# Patient Record
Sex: Female | Born: 1970 | Race: Black or African American | Hispanic: No | Marital: Single | Smoking: Never smoker
Health system: Southern US, Community
[De-identification: ages and names within clinical notes are randomized; demographics above are authoritative.]

## PROBLEM LIST (undated history)

## (undated) DIAGNOSIS — N941 Unspecified dyspareunia: Secondary | ICD-10-CM

## (undated) DIAGNOSIS — Z87440 Personal history of urinary (tract) infections: Secondary | ICD-10-CM

## (undated) DIAGNOSIS — N760 Acute vaginitis: Secondary | ICD-10-CM

## (undated) DIAGNOSIS — B009 Herpesviral infection, unspecified: Secondary | ICD-10-CM

## (undated) DIAGNOSIS — R5383 Other fatigue: Secondary | ICD-10-CM

## (undated) DIAGNOSIS — Z8742 Personal history of other diseases of the female genital tract: Secondary | ICD-10-CM

## (undated) DIAGNOSIS — R87619 Unspecified abnormal cytological findings in specimens from cervix uteri: Secondary | ICD-10-CM

## (undated) DIAGNOSIS — R3129 Other microscopic hematuria: Secondary | ICD-10-CM

## (undated) DIAGNOSIS — N979 Female infertility, unspecified: Secondary | ICD-10-CM

## (undated) DIAGNOSIS — B373 Candidiasis of vulva and vagina: Secondary | ICD-10-CM

## (undated) DIAGNOSIS — IMO0002 Reserved for concepts with insufficient information to code with codable children: Secondary | ICD-10-CM

## (undated) DIAGNOSIS — D219 Benign neoplasm of connective and other soft tissue, unspecified: Secondary | ICD-10-CM

## (undated) DIAGNOSIS — N840 Polyp of corpus uteri: Secondary | ICD-10-CM

## (undated) DIAGNOSIS — Z87448 Personal history of other diseases of urinary system: Secondary | ICD-10-CM

## (undated) DIAGNOSIS — N926 Irregular menstruation, unspecified: Secondary | ICD-10-CM

## (undated) DIAGNOSIS — B9689 Other specified bacterial agents as the cause of diseases classified elsewhere: Secondary | ICD-10-CM

## (undated) DIAGNOSIS — R102 Pelvic and perineal pain: Secondary | ICD-10-CM

## (undated) HISTORY — PX: WISDOM TOOTH EXTRACTION: SHX21

## (undated) HISTORY — DX: Other microscopic hematuria: R31.29

## (undated) HISTORY — DX: Other fatigue: R53.83

## (undated) HISTORY — DX: Other specified bacterial agents as the cause of diseases classified elsewhere: B96.89

## (undated) HISTORY — DX: Irregular menstruation, unspecified: N92.6

## (undated) HISTORY — DX: Candidiasis of vulva and vagina: B37.3

## (undated) HISTORY — DX: Female infertility, unspecified: N97.9

## (undated) HISTORY — DX: Reserved for concepts with insufficient information to code with codable children: IMO0002

## (undated) HISTORY — DX: Unspecified dyspareunia: N94.10

## (undated) HISTORY — DX: Personal history of urinary (tract) infections: Z87.440

## (undated) HISTORY — DX: Polyp of corpus uteri: N84.0

## (undated) HISTORY — DX: Herpesviral infection, unspecified: B00.9

## (undated) HISTORY — DX: Pelvic and perineal pain: R10.2

## (undated) HISTORY — DX: Acute vaginitis: N76.0

## (undated) HISTORY — DX: Benign neoplasm of connective and other soft tissue, unspecified: D21.9

## (undated) HISTORY — DX: Unspecified abnormal cytological findings in specimens from cervix uteri: R87.619

## (undated) HISTORY — DX: Personal history of other diseases of urinary system: Z87.448

## (undated) HISTORY — DX: Personal history of other diseases of the female genital tract: Z87.42

---

## 1997-10-21 ENCOUNTER — Emergency Department (HOSPITAL_COMMUNITY): Admission: EM | Admit: 1997-10-21 | Discharge: 1997-10-21 | Payer: Self-pay | Admitting: Emergency Medicine

## 1997-10-22 ENCOUNTER — Encounter: Payer: Self-pay | Admitting: Emergency Medicine

## 1997-12-29 ENCOUNTER — Other Ambulatory Visit: Admission: RE | Admit: 1997-12-29 | Discharge: 1997-12-29 | Payer: Self-pay | Admitting: Obstetrics & Gynecology

## 1998-01-28 ENCOUNTER — Emergency Department (HOSPITAL_COMMUNITY): Admission: EM | Admit: 1998-01-28 | Discharge: 1998-01-28 | Payer: Self-pay | Admitting: Emergency Medicine

## 1998-02-14 ENCOUNTER — Encounter: Admission: RE | Admit: 1998-02-14 | Discharge: 1998-04-03 | Payer: Self-pay | Admitting: Family Medicine

## 1998-02-16 ENCOUNTER — Ambulatory Visit (HOSPITAL_COMMUNITY): Admission: RE | Admit: 1998-02-16 | Discharge: 1998-02-16 | Payer: Self-pay | Admitting: Family Medicine

## 1998-02-16 ENCOUNTER — Encounter: Payer: Self-pay | Admitting: Family Medicine

## 1998-07-03 ENCOUNTER — Encounter: Admission: RE | Admit: 1998-07-03 | Discharge: 1998-09-01 | Payer: Self-pay | Admitting: Family Medicine

## 1998-09-06 ENCOUNTER — Ambulatory Visit (HOSPITAL_BASED_OUTPATIENT_CLINIC_OR_DEPARTMENT_OTHER): Admission: RE | Admit: 1998-09-06 | Discharge: 1998-09-06 | Payer: Self-pay | Admitting: Orthopedic Surgery

## 1998-12-01 ENCOUNTER — Encounter: Admission: RE | Admit: 1998-12-01 | Discharge: 1999-01-30 | Payer: Self-pay | Admitting: Family Medicine

## 1999-03-30 ENCOUNTER — Other Ambulatory Visit: Admission: RE | Admit: 1999-03-30 | Discharge: 1999-03-30 | Payer: Self-pay | Admitting: Obstetrics and Gynecology

## 2000-08-18 ENCOUNTER — Other Ambulatory Visit: Admission: RE | Admit: 2000-08-18 | Discharge: 2000-08-18 | Payer: Self-pay | Admitting: Obstetrics and Gynecology

## 2001-02-18 DIAGNOSIS — B9689 Other specified bacterial agents as the cause of diseases classified elsewhere: Secondary | ICD-10-CM

## 2001-02-18 HISTORY — DX: Other specified bacterial agents as the cause of diseases classified elsewhere: B96.89

## 2001-07-08 DIAGNOSIS — Z8742 Personal history of other diseases of the female genital tract: Secondary | ICD-10-CM

## 2001-07-08 HISTORY — DX: Personal history of other diseases of the female genital tract: Z87.42

## 2001-09-29 ENCOUNTER — Other Ambulatory Visit: Admission: RE | Admit: 2001-09-29 | Discharge: 2001-09-29 | Payer: Self-pay | Admitting: Obstetrics and Gynecology

## 2002-10-29 ENCOUNTER — Other Ambulatory Visit: Admission: RE | Admit: 2002-10-29 | Discharge: 2002-10-29 | Payer: Self-pay | Admitting: Obstetrics and Gynecology

## 2003-02-19 DIAGNOSIS — R5383 Other fatigue: Secondary | ICD-10-CM

## 2003-02-19 HISTORY — DX: Other fatigue: R53.83

## 2003-10-31 ENCOUNTER — Other Ambulatory Visit: Admission: RE | Admit: 2003-10-31 | Discharge: 2003-10-31 | Payer: Self-pay | Admitting: Obstetrics and Gynecology

## 2004-11-12 ENCOUNTER — Other Ambulatory Visit: Admission: RE | Admit: 2004-11-12 | Discharge: 2004-11-12 | Payer: Self-pay | Admitting: Obstetrics and Gynecology

## 2005-02-18 DIAGNOSIS — N926 Irregular menstruation, unspecified: Secondary | ICD-10-CM

## 2005-02-18 DIAGNOSIS — Z87440 Personal history of urinary (tract) infections: Secondary | ICD-10-CM

## 2005-02-18 HISTORY — DX: Personal history of urinary (tract) infections: Z87.440

## 2005-02-18 HISTORY — DX: Irregular menstruation, unspecified: N92.6

## 2006-02-18 DIAGNOSIS — B3731 Acute candidiasis of vulva and vagina: Secondary | ICD-10-CM

## 2006-02-18 DIAGNOSIS — B373 Candidiasis of vulva and vagina: Secondary | ICD-10-CM

## 2006-02-18 HISTORY — DX: Candidiasis of vulva and vagina: B37.3

## 2006-02-18 HISTORY — DX: Acute candidiasis of vulva and vagina: B37.31

## 2008-02-19 DIAGNOSIS — B373 Candidiasis of vulva and vagina: Secondary | ICD-10-CM

## 2008-02-19 DIAGNOSIS — Z87448 Personal history of other diseases of urinary system: Secondary | ICD-10-CM

## 2008-02-19 DIAGNOSIS — R102 Pelvic and perineal pain: Secondary | ICD-10-CM

## 2008-02-19 DIAGNOSIS — D219 Benign neoplasm of connective and other soft tissue, unspecified: Secondary | ICD-10-CM

## 2008-02-19 DIAGNOSIS — B3731 Acute candidiasis of vulva and vagina: Secondary | ICD-10-CM

## 2008-02-19 HISTORY — DX: Acute candidiasis of vulva and vagina: B37.31

## 2008-02-19 HISTORY — DX: Candidiasis of vulva and vagina: B37.3

## 2008-02-19 HISTORY — DX: Pelvic and perineal pain: R10.2

## 2008-02-19 HISTORY — DX: Personal history of other diseases of urinary system: Z87.448

## 2008-02-19 HISTORY — DX: Benign neoplasm of connective and other soft tissue, unspecified: D21.9

## 2008-10-21 DIAGNOSIS — R3129 Other microscopic hematuria: Secondary | ICD-10-CM

## 2008-10-21 HISTORY — DX: Other microscopic hematuria: R31.29

## 2009-02-18 DIAGNOSIS — N941 Unspecified dyspareunia: Secondary | ICD-10-CM

## 2009-02-18 HISTORY — DX: Unspecified dyspareunia: N94.10

## 2009-06-10 ENCOUNTER — Ambulatory Visit (HOSPITAL_COMMUNITY): Admission: RE | Admit: 2009-06-10 | Discharge: 2009-06-10 | Payer: Self-pay | Admitting: Obstetrics and Gynecology

## 2009-06-10 DIAGNOSIS — N979 Female infertility, unspecified: Secondary | ICD-10-CM

## 2009-06-10 DIAGNOSIS — Z8742 Personal history of other diseases of the female genital tract: Secondary | ICD-10-CM

## 2009-06-10 DIAGNOSIS — N840 Polyp of corpus uteri: Secondary | ICD-10-CM

## 2009-06-10 HISTORY — DX: Polyp of corpus uteri: N84.0

## 2009-06-10 HISTORY — DX: Personal history of other diseases of the female genital tract: Z87.42

## 2009-06-10 HISTORY — DX: Female infertility, unspecified: N97.9

## 2010-05-08 LAB — URINALYSIS, ROUTINE W REFLEX MICROSCOPIC
Bilirubin Urine: NEGATIVE
Glucose, UA: NEGATIVE mg/dL
Hgb urine dipstick: NEGATIVE
Ketones, ur: NEGATIVE mg/dL
Nitrite: NEGATIVE
Protein, ur: NEGATIVE mg/dL
Specific Gravity, Urine: >1.03 — ABNORMAL HIGH (ref 1.005–1.030)
Urobilinogen, UA: 0.2 mg/dL (ref 0.0–1.0)
pH: 5.5 (ref 5.0–8.0)

## 2010-05-08 LAB — CBC
HCT: 32.3 % — ABNORMAL LOW (ref 36.0–46.0)
Hemoglobin: 10.9 g/dL — ABNORMAL LOW (ref 12.0–15.0)
MCHC: 33.6 g/dL (ref 30.0–36.0)
MCV: 91 fL (ref 78.0–100.0)
Platelets: 270 10*3/uL (ref 150–400)
RBC: 3.55 MIL/uL — ABNORMAL LOW (ref 3.87–5.11)
RDW: 13 % (ref 11.5–15.5)
WBC: 5.6 10*3/uL (ref 4.0–10.5)

## 2010-05-08 LAB — PREGNANCY, URINE: Preg Test, Ur: NEGATIVE

## 2010-05-28 ENCOUNTER — Other Ambulatory Visit (HOSPITAL_COMMUNITY): Payer: Self-pay | Admitting: Obstetrics and Gynecology

## 2010-05-28 DIAGNOSIS — N979 Female infertility, unspecified: Secondary | ICD-10-CM

## 2010-06-04 ENCOUNTER — Ambulatory Visit (HOSPITAL_COMMUNITY)
Admission: RE | Admit: 2010-06-04 | Discharge: 2010-06-04 | Disposition: A | Discharge status: Home / Self Care | Payer: BC Managed Care – PPO | Source: Ambulatory Visit | Attending: Obstetrics and Gynecology | Admitting: Obstetrics and Gynecology

## 2010-06-04 DIAGNOSIS — N979 Female infertility, unspecified: Secondary | ICD-10-CM | POA: Insufficient documentation

## 2011-02-14 ENCOUNTER — Encounter (HOSPITAL_COMMUNITY): Payer: Self-pay | Admitting: *Deleted

## 2011-02-14 ENCOUNTER — Inpatient Hospital Stay (HOSPITAL_COMMUNITY)
Admission: AD | Admit: 2011-02-14 | Discharge: 2011-02-15 | Disposition: A | Discharge status: Home / Self Care | Payer: BC Managed Care – PPO | Source: Ambulatory Visit | Attending: Obstetrics and Gynecology | Admitting: Obstetrics and Gynecology

## 2011-02-14 DIAGNOSIS — O039 Complete or unspecified spontaneous abortion without complication: Secondary | ICD-10-CM | POA: Insufficient documentation

## 2011-02-14 LAB — CBC
MCV: 90.3 fL (ref 78.0–100.0)
Platelets: 274 10*3/uL (ref 150–400)
RBC: 3.62 MIL/uL — ABNORMAL LOW (ref 3.87–5.11)
RDW: 12.8 % (ref 11.5–15.5)
WBC: 7.8 10*3/uL (ref 4.0–10.5)

## 2011-02-14 MED ORDER — OXYCODONE-ACETAMINOPHEN 5-325 MG PO TABS
2.0000 | ORAL_TABLET | Freq: Once | ORAL | Status: AC
  Administered 2011-02-14: 2 via ORAL
  Filled 2011-02-14: qty 2

## 2011-02-14 MED ORDER — KETOROLAC TROMETHAMINE 30 MG/ML IJ SOLN
60.0000 mg | Freq: Once | INTRAMUSCULAR | Status: AC
  Administered 2011-02-14: 60 mg via INTRAMUSCULAR
  Filled 2011-02-14: qty 2

## 2011-02-14 NOTE — Progress Notes (Signed)
Pt G1 at 11.3wks, told today in the office- no fetal heartbeat.  Pt having cramping and vaginal bleeding started today.  Fetal size 8wk per U/S today.

## 2011-02-14 NOTE — ED Provider Notes (Signed)
History     Chief Complaint  Patient presents with  . Abdominal Cramping  . Vaginal Bleeding   HPI Comments: Pt is a 40yo G1P0 at [redacted]w[redacted]d by LMP of 05/26/10. She was seen today at the office and found to have a missed AB with fetus measuring about 8wks. In the last 2 hours she has had an increase in bleeding like a menstrual period and passed a clot. She also has increased pain/cramping. She denies any significant med hx. NKDA, unsure of blood type.   Abdominal Cramping  Vaginal Bleeding Associated symptoms include abdominal pain.      Past Medical History  Diagnosis Date  . No pertinent past medical history     No past surgical history on file.  No family history on file.  History  Substance Use Topics  . Smoking status: Not on file  . Smokeless tobacco: Not on file  . Alcohol Use: Not on file    Allergies: No Known Allergies  Prescriptions prior to admission  Medication Sig Dispense Refill  . OVER THE COUNTER MEDICATION Take 1 tablet by mouth daily. "Hairfinity" supplement       . Prenatal Vit-Fe Fumarate-FA (PRENATAL MULTIVITAMIN) TABS Take 1 tablet by mouth daily.          Review of Systems  Gastrointestinal: Positive for abdominal pain.       Cramping  Genitourinary: Positive for vaginal bleeding.  All other systems reviewed and are negative.   Physical Exam   Blood pressure 130/98, pulse 86, temperature 98.5 F (36.9 C), temperature source Oral, resp. rate 18, height 5\' 2"  (1.575 m), weight 60.873 kg (134 lb 3.2 oz), last menstrual period 05/26/2010.  Physical Exam  Nursing note and vitals reviewed. Constitutional: She is oriented to person, place, and time. She appears well-developed and well-nourished. She appears distressed.       Writhing in bed, grimacing  Cardiovascular: Normal rate.   Respiratory: Effort normal.  GI: Soft. She exhibits no distension and no mass. There is tenderness. There is no rebound and no guarding.  Genitourinary: Vaginal  discharge found.       Deferred pelvic until patient has had pain medication  Musculoskeletal: Normal range of motion.  Neurological: She is alert and oriented to person, place, and time.  Skin: Skin is warm and dry.  Psychiatric: She has a normal mood and affect. Her behavior is normal. Judgment and thought content normal.    MAU Course  Procedures    Assessment and Plan  AB in progress  Cbc Quant T&S toradol and percocet Will then do pelvic exam  Dyland Panuco M 02/14/2011, 11:24 PM

## 2011-02-14 NOTE — Progress Notes (Signed)
Pt presents to MAU with chief complaint of vaginal bleeding. Pt was at the office today and she was told baby did not have a heart beat. Pt was unable to make a decision about next steps. Presents to MAU with abdominal pain and bleeding that has worsened.

## 2011-02-15 LAB — TYPE AND SCREEN
ABO/RH(D): A POS
Antibody Screen: NEGATIVE

## 2011-02-15 MED ORDER — PROMETHAZINE HCL 25 MG/ML IJ SOLN
25.0000 mg | Freq: Once | INTRAMUSCULAR | Status: AC
  Administered 2011-02-15: 25 mg via INTRAMUSCULAR
  Filled 2011-02-15: qty 1

## 2011-02-15 MED ORDER — IBUPROFEN 800 MG PO TABS: 800.0000 mg | ORAL_TABLET | Freq: Three times a day (TID) | ORAL | Status: AC | PRN

## 2011-02-15 MED ORDER — MORPHINE SULFATE 4 MG/ML IJ SOLN
8.0000 mg | Freq: Once | INTRAMUSCULAR | Status: DC
  Filled 2011-02-15: qty 2

## 2011-02-15 MED ORDER — OXYCODONE-ACETAMINOPHEN 5-325 MG PO TABS: 1.0000 | ORAL_TABLET | ORAL | Status: AC | PRN

## 2011-02-15 MED ORDER — MORPHINE SULFATE 4 MG/ML IJ SOLN: 8.0000 mg | Freq: Once | INTRAMUSCULAR | Status: DC

## 2011-02-15 NOTE — Progress Notes (Signed)
Gevena Barre, CNM at bedside.  poc discussed with pt.

## 2011-02-15 NOTE — Progress Notes (Signed)
Pt states pain is not any better.  CNM notified.

## 2011-02-15 NOTE — Progress Notes (Signed)
Entered room to administer morphine and phenergan. Pt states she felt a large gush.  States she felt relief after.  Asked pt if she thought she needed the morphine.  Pt states she would like to wait and not get medicine.  Pt up to bathroom to pee.  When urinating called into bathroom to view clot in toilet.  Appears to be miscarriage.  Pt states her pain is gone.  CNM notified.  Viewed clot in toiled.  Ok to discard specimen.

## 2011-02-15 NOTE — Progress Notes (Addendum)
Pt has no pain now, up to void and passed what appears to be POC No active bleeding now Blood type is A pos Quant is 1408  D/C home  F/u office 1 week Will have weekly quants drawn Bleeding precautions and miscarriage instructions given

## 2011-02-15 NOTE — Progress Notes (Signed)
S: pt feels no relief from pain meds, continues to writhe and moan in pain O: CBC is nl Pad on scant blood from the last 2 hours A: ab in progress P: morphine/phenergan IM for pain Discussed options with pt and she elects for [facility], last ate at about 10pm Will D/W Dr Stefano Gaul

## 2011-02-15 NOTE — Progress Notes (Signed)
Gevena Barre, CNM at bedside.  Discussed D&C versus cytotec.  Pt wishes to have D&C.  Pt states she ate 2 and a half hours ago.

## 2011-04-17 ENCOUNTER — Encounter (INDEPENDENT_AMBULATORY_CARE_PROVIDER_SITE_OTHER): Payer: BC Managed Care – PPO | Admitting: Obstetrics and Gynecology

## 2011-04-17 DIAGNOSIS — N970 Female infertility associated with anovulation: Secondary | ICD-10-CM

## 2011-07-22 ENCOUNTER — Other Ambulatory Visit: Payer: Self-pay | Admitting: Obstetrics and Gynecology

## 2011-07-24 ENCOUNTER — Telehealth: Payer: Self-pay

## 2011-07-24 NOTE — Telephone Encounter (Signed)
Rx refill requested for Clomid 50mg  awaiting AVS approval.

## 2011-07-29 ENCOUNTER — Telehealth: Payer: Self-pay

## 2011-07-29 NOTE — Telephone Encounter (Signed)
LVMOM for pt to call back. PEr AVS Rx request Clomid was denied b/c pt will need to come in for appt. Will await pt call back. LC/CMA

## 2011-08-01 ENCOUNTER — Telehealth: Payer: Self-pay | Admitting: Obstetrics and Gynecology

## 2011-08-01 NOTE — Telephone Encounter (Signed)
Tc from pt per telephone call. Told pt avs recs of needed an appt for start of Clomid. Appt sched 08/15/11 with avs for eval. Pt voices understanding.

## 2011-08-01 NOTE — Telephone Encounter (Signed)
Lm on vm to cb per telephone call.  

## 2011-08-01 NOTE — Telephone Encounter (Signed)
Triage/contact notes state Colleen Luna has cld several times

## 2011-08-02 ENCOUNTER — Telehealth: Payer: Self-pay

## 2011-08-02 NOTE — Telephone Encounter (Signed)
Pt was called again to be made aware that she needs an appt. before she can receive a Rx for clomid.pt was last called on 07-29-11. I will await pt call back.

## 2011-08-15 ENCOUNTER — Telehealth: Payer: Self-pay | Admitting: Obstetrics and Gynecology

## 2011-08-15 ENCOUNTER — Ambulatory Visit (INDEPENDENT_AMBULATORY_CARE_PROVIDER_SITE_OTHER): Payer: BC Managed Care – PPO | Admitting: Obstetrics and Gynecology

## 2011-08-15 ENCOUNTER — Encounter: Payer: Self-pay | Admitting: Obstetrics and Gynecology

## 2011-08-15 VITALS — BP 98/68 | Temp 98.3°F | Resp 16 | Ht 62.5 in | Wt 131.0 lb

## 2011-08-15 DIAGNOSIS — O021 Missed abortion: Secondary | ICD-10-CM

## 2011-08-15 DIAGNOSIS — N970 Female infertility associated with anovulation: Secondary | ICD-10-CM

## 2011-08-15 DIAGNOSIS — B009 Herpesviral infection, unspecified: Secondary | ICD-10-CM

## 2011-08-15 DIAGNOSIS — D869 Sarcoidosis, unspecified: Secondary | ICD-10-CM

## 2011-08-15 DIAGNOSIS — Z8619 Personal history of other infectious and parasitic diseases: Secondary | ICD-10-CM

## 2011-08-15 DIAGNOSIS — Z3201 Encounter for pregnancy test, result positive: Secondary | ICD-10-CM

## 2011-08-15 MED ORDER — CLOMIPHENE CITRATE 50 MG PO TABS: 50.0000 mg | ORAL_TABLET | Freq: Every day | ORAL | Status: DC

## 2011-08-15 NOTE — Telephone Encounter (Signed)
Message given to Einar Crow to inform Dr AVS.  About pt's insurance  Coverage for Femara.

## 2011-08-15 NOTE — Telephone Encounter (Signed)
TC to pt.   LM that message had been relayed to DR AVS. To call with any questions.

## 2011-08-15 NOTE — Progress Notes (Signed)
HISTORY OF PRESENT ILLNESS  [name] is a 41 y.o. year old female,G1P0, who presents for a problem visit. The patient has a history of infertility.  She has been treated with Clomid 50 mg for multiple cycles.  In 2012 she had a first trimester miscarriage.  She has not been seen since February of 2013.  She has continued to take clomiphene on her own.  The patient has a past history of sarcoidosis.  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  She has a history of herpes virus 1.  Subjective:  The patient denies abdominal pain.  Objective:  BP 98/68  Temp 98.3 F (36.8 C)  Resp 16  Ht 5' 2.5" (1.588 m)  Wt 131 lb (59.421 kg)  BMI 23.58 kg/m2  LMP 07/19/2011  Breastfeeding? Unknown   General: alert, cooperative and no distress GI: soft, non-tender; bowel sounds normal; no masses,  no organomegaly  External genitalia: normal general appearance Vaginal: normal without tenderness, induration or masses Cervix: normal appearance Adnexa: normal bimanual exam Uterus: normal size shape and consistency  Urine pregnancy test: Negative  Assessment:  Infertility Status post first trimester missed abortion Sarcoidosis Herpes virus 1  Plan:  A long discussion was had with the patient about her treatment options.  IVF was declined.  The patient and the patient was to try Femara 2.5 mg each day between day 3 and day 7. We will measure progesterone on day 21. Return to the office on day 26/27 so that we can evaluate the ovaries.  Return to office in 4 week(s).   Leonard Schwartz M.D.  08/16/2011 6:24 AM

## 2011-08-16 DIAGNOSIS — N970 Female infertility associated with anovulation: Secondary | ICD-10-CM | POA: Insufficient documentation

## 2011-08-16 DIAGNOSIS — D869 Sarcoidosis, unspecified: Secondary | ICD-10-CM | POA: Insufficient documentation

## 2011-08-16 DIAGNOSIS — Z8619 Personal history of other infectious and parasitic diseases: Secondary | ICD-10-CM | POA: Insufficient documentation

## 2011-08-16 DIAGNOSIS — B009 Herpesviral infection, unspecified: Secondary | ICD-10-CM | POA: Insufficient documentation

## 2011-08-16 DIAGNOSIS — O021 Missed abortion: Secondary | ICD-10-CM | POA: Insufficient documentation

## 2011-08-16 MED ORDER — LETROZOLE 2.5 MG PO TABS: 2.5000 mg | ORAL_TABLET | Freq: Every day | ORAL | Status: AC

## 2011-08-20 ENCOUNTER — Telehealth: Payer: Self-pay | Admitting: Obstetrics and Gynecology

## 2011-08-20 NOTE — Telephone Encounter (Signed)
Triage/general quest. 

## 2011-08-20 NOTE — Telephone Encounter (Signed)
Lm on vm tcb rgd msg 

## 2011-08-20 NOTE — Telephone Encounter (Signed)
Spoke with pt rgd msg pt wants to know how to take femara informed one tab daily days 3-7 of cycle pt voice understanding

## 2011-09-12 ENCOUNTER — Encounter: Payer: BC Managed Care – PPO | Admitting: Obstetrics and Gynecology

## 2011-09-30 ENCOUNTER — Encounter: Payer: BC Managed Care – PPO | Admitting: Obstetrics and Gynecology

## 2011-11-19 ENCOUNTER — Telehealth: Payer: Self-pay | Admitting: Obstetrics and Gynecology

## 2011-11-19 NOTE — Telephone Encounter (Signed)
Tc to pt regarding msg.  States is experiencing some mild vaginal odor and discharge and wants an appt.  Pt sched appt with AVS tomorrow 11/20/11 @ 1600 for eval.

## 2011-11-20 ENCOUNTER — Ambulatory Visit (INDEPENDENT_AMBULATORY_CARE_PROVIDER_SITE_OTHER): Payer: BC Managed Care – PPO | Admitting: Obstetrics and Gynecology

## 2011-11-20 ENCOUNTER — Encounter: Payer: Self-pay | Admitting: Obstetrics and Gynecology

## 2011-11-20 VITALS — BP 92/60 | HR 98 | Temp 98.2°F | Resp 14 | Ht 62.0 in | Wt 132.0 lb

## 2011-11-20 DIAGNOSIS — N898 Other specified noninflammatory disorders of vagina: Secondary | ICD-10-CM

## 2011-11-20 DIAGNOSIS — N760 Acute vaginitis: Secondary | ICD-10-CM

## 2011-11-20 DIAGNOSIS — R350 Frequency of micturition: Secondary | ICD-10-CM

## 2011-11-20 DIAGNOSIS — L293 Anogenital pruritus, unspecified: Secondary | ICD-10-CM

## 2011-11-20 DIAGNOSIS — N390 Urinary tract infection, site not specified: Secondary | ICD-10-CM

## 2011-11-20 LAB — POCT URINALYSIS DIPSTICK
Bilirubin, UA: NEGATIVE
Blood, UA: NEGATIVE
Ketones, UA: NEGATIVE
Leukocytes, UA: NEGATIVE
Protein, UA: NEGATIVE
Spec Grav, UA: 1.025
pH, UA: 5

## 2011-11-20 LAB — POCT WET PREP (WET MOUNT)

## 2011-11-20 MED ORDER — METRONIDAZOLE 0.75 % VA GEL: 1.0000 | Freq: Two times a day (BID) | VAGINAL | Status: DC

## 2011-11-20 MED ORDER — FLUCONAZOLE 150 MG PO TABS: 150.0000 mg | ORAL_TABLET | Freq: Every day | ORAL | Status: DC

## 2011-11-20 NOTE — Progress Notes (Signed)
  HISTORY OF PRESENT ILLNESS  [name] is a 41 y.o. year old female,G1P0, who presents for a problem visit. The patient has a history of vaginitis. She has a history of infertility.  Subjective:  The patient complains of vaginal itching. She complains of urinary frequency  Objective:  BP 92/60  Pulse 98  Temp 98.2 F (36.8 C) (Oral)  Resp 14  Ht 5\' 2"  (1.575 m)  Wt 132 lb (59.875 kg)  BMI 24.14 kg/m2  LMP 11/11/2011   General: no distress GI: soft and nontender  External genitalia: normal general appearance Vaginal: normal without tenderness, induration or masses Cervix: normal appearance Adnexa: normal bimanual exam Uterus: upper limits normal size  Wet prep: Yeast  Urine analysis: Negative  Assessment:  Yeast vaginitis  Urinary frequency  Plan:  Diflucan 150 mg x1 MetroGel vaginal per patient request  Return to office in 1 month(s) for annual exam.   Leonard Schwartz M.D.  11/20/2011 5:21 PM   Odor: yes Fever: no Pelvic Pain: no  Itching: yes Dyspareunia: no Desires GC/CT: no  Thin: yes History of PID: no Desires HIV,RPR,HbsAG: no  Thick: no History of STD: no Other: slight pelvic pain pt believes she may have a UTI. Pt denies any burning w/urination no blood.

## 2011-11-25 ENCOUNTER — Telehealth: Payer: Self-pay | Admitting: Obstetrics and Gynecology

## 2011-11-25 NOTE — Telephone Encounter (Signed)
TC TO PT REGARDING MESSAGE. PT WANTED TO KNOW IF ANY STD COULD BE DETECTED WITH A WET PREP AND I TOLD THE PT THAT YOU CAN DETECT TRICH WITH THE WET PREP. PT STATES THAT SHE WANT A APPT TO BE TESTED FOR STD'S. SCHEDULED PT AN APPT WITH EP TO GET STD TESTING. PT VOICED UNDERSTANDING.

## 2011-11-26 ENCOUNTER — Ambulatory Visit (INDEPENDENT_AMBULATORY_CARE_PROVIDER_SITE_OTHER): Payer: BC Managed Care – PPO | Admitting: Obstetrics and Gynecology

## 2011-11-26 ENCOUNTER — Encounter: Payer: Self-pay | Admitting: Obstetrics and Gynecology

## 2011-11-26 VITALS — BP 110/70 | HR 74 | Wt 131.0 lb

## 2011-11-26 DIAGNOSIS — N898 Other specified noninflammatory disorders of vagina: Secondary | ICD-10-CM

## 2011-11-26 DIAGNOSIS — Z113 Encounter for screening for infections with a predominantly sexual mode of transmission: Secondary | ICD-10-CM

## 2011-11-26 LAB — HEPATITIS B SURFACE ANTIGEN: Hepatitis B Surface Ag: NEGATIVE

## 2011-11-26 LAB — HEPATITIS C ANTIBODY: HCV Ab: NEGATIVE

## 2011-11-26 MED ORDER — FLUCONAZOLE 150 MG PO TABS: 150.0000 mg | ORAL_TABLET | Freq: Once | ORAL | Status: DC

## 2011-11-26 MED ORDER — CLOTRIMAZOLE-BETAMETHASONE 1-0.05 % EX CREA: TOPICAL_CREAM | Freq: Two times a day (BID) | CUTANEOUS | Status: AC

## 2011-11-26 NOTE — Progress Notes (Signed)
41 YO for STD testing and to be checked for vaginal yeast infection.  Admits to some itching even though she has recently been treated with Diflucan.  O: Pelvic: EGBUS-normal, vagina-normal, cervix-no lesions, uterus/adnexae-normal    Wet Prep: pH-4.5,  whiff-negative, no yeast, clue cells or trichomonas  A: Vaginitis Follow up      STD Testing  P:  STD testing        Lotrisone Cream #30  apply to affected area bid x 7-14 days no refills        RTO-as scheduled or prn  Analicia Skibinski, PA-C

## 2011-11-26 NOTE — Patient Instructions (Signed)
OTC Lotrimin Cream

## 2011-11-27 LAB — GC/CHLAMYDIA PROBE AMP, GENITAL: GC Probe Amp, Genital: NEGATIVE

## 2011-11-27 LAB — HSV 2 ANTIBODY, IGG: HSV 2 Glycoprotein G Ab, IgG: 0.18 IV

## 2011-12-05 ENCOUNTER — Telehealth: Payer: Self-pay | Admitting: Obstetrics and Gynecology

## 2011-12-05 NOTE — Telephone Encounter (Signed)
Spoke with pt rgd msg pt c/o bleeding but realize it may be her cycle will monitor bleeding and call office if soaking a pad an hr

## 2012-08-06 ENCOUNTER — Other Ambulatory Visit: Payer: Self-pay | Admitting: Family Medicine

## 2012-08-06 ENCOUNTER — Ambulatory Visit
Admission: RE | Admit: 2012-08-06 | Discharge: 2012-08-06 | Disposition: A | Discharge status: Home / Self Care | Payer: BC Managed Care – PPO | Source: Ambulatory Visit | Attending: Family Medicine | Admitting: Family Medicine

## 2012-08-06 DIAGNOSIS — R109 Unspecified abdominal pain: Secondary | ICD-10-CM

## 2013-12-20 ENCOUNTER — Encounter: Payer: Self-pay | Admitting: Obstetrics and Gynecology

## 2014-04-05 ENCOUNTER — Other Ambulatory Visit: Payer: Self-pay | Admitting: Obstetrics and Gynecology

## 2014-04-05 DIAGNOSIS — Z1231 Encounter for screening mammogram for malignant neoplasm of breast: Secondary | ICD-10-CM

## 2014-04-08 ENCOUNTER — Encounter (INDEPENDENT_AMBULATORY_CARE_PROVIDER_SITE_OTHER): Payer: Self-pay

## 2014-04-08 ENCOUNTER — Ambulatory Visit
Admission: RE | Admit: 2014-04-08 | Discharge: 2014-04-08 | Disposition: A | Discharge status: Home / Self Care | Payer: BC Managed Care – PPO | Source: Ambulatory Visit | Attending: Obstetrics and Gynecology | Admitting: Obstetrics and Gynecology

## 2014-04-08 DIAGNOSIS — Z1231 Encounter for screening mammogram for malignant neoplasm of breast: Secondary | ICD-10-CM

## 2016-07-15 ENCOUNTER — Emergency Department (HOSPITAL_COMMUNITY)
Admission: EM | Admit: 2016-07-15 | Discharge: 2016-07-15 | Disposition: A | Discharge status: Home / Self Care | Payer: PRIVATE HEALTH INSURANCE | Attending: Emergency Medicine | Admitting: Emergency Medicine

## 2016-07-15 ENCOUNTER — Emergency Department (HOSPITAL_COMMUNITY): Payer: PRIVATE HEALTH INSURANCE

## 2016-07-15 ENCOUNTER — Encounter (HOSPITAL_COMMUNITY): Payer: Self-pay | Admitting: Emergency Medicine

## 2016-07-15 DIAGNOSIS — Y939 Activity, unspecified: Secondary | ICD-10-CM | POA: Insufficient documentation

## 2016-07-15 DIAGNOSIS — S0003XA Contusion of scalp, initial encounter: Secondary | ICD-10-CM | POA: Diagnosis present

## 2016-07-15 DIAGNOSIS — Z91040 Latex allergy status: Secondary | ICD-10-CM | POA: Diagnosis not present

## 2016-07-15 DIAGNOSIS — S1093XA Contusion of unspecified part of neck, initial encounter: Secondary | ICD-10-CM

## 2016-07-15 DIAGNOSIS — Y999 Unspecified external cause status: Secondary | ICD-10-CM | POA: Diagnosis not present

## 2016-07-15 DIAGNOSIS — S20221A Contusion of right back wall of thorax, initial encounter: Secondary | ICD-10-CM | POA: Insufficient documentation

## 2016-07-15 DIAGNOSIS — Z79899 Other long term (current) drug therapy: Secondary | ICD-10-CM | POA: Insufficient documentation

## 2016-07-15 DIAGNOSIS — Y929 Unspecified place or not applicable: Secondary | ICD-10-CM | POA: Insufficient documentation

## 2016-07-15 DIAGNOSIS — S20229A Contusion of unspecified back wall of thorax, initial encounter: Secondary | ICD-10-CM

## 2016-07-15 LAB — I-STAT BETA HCG BLOOD, ED (MC, WL, AP ONLY): I-stat hCG, quantitative: <5 m[IU]/mL (ref <5)

## 2016-07-15 MED ORDER — TRAMADOL HCL 50 MG PO TABS: 50.0000 mg | ORAL_TABLET | Freq: Four times a day (QID) | ORAL | 0 refills | Status: DC | PRN
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
## 2016-07-15 NOTE — Discharge Instructions (Signed)
Take acetaminophen and/or ibuprofen as needed for less severe pain.

## 2017-08-04 ENCOUNTER — Other Ambulatory Visit: Payer: Self-pay | Admitting: Obstetrics and Gynecology

## 2017-08-06 ENCOUNTER — Other Ambulatory Visit: Payer: Self-pay | Admitting: Physician Assistant

## 2017-08-06 DIAGNOSIS — N644 Mastodynia: Secondary | ICD-10-CM
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
## 2020-02-14 ENCOUNTER — Other Ambulatory Visit: Payer: Self-pay | Admitting: Gastroenterology

## 2020-02-14 DIAGNOSIS — R1084 Generalized abdominal pain: Secondary | ICD-10-CM

## 2020-03-10 ENCOUNTER — Other Ambulatory Visit: Payer: Self-pay

## 2020-03-10 ENCOUNTER — Encounter (HOSPITAL_COMMUNITY): Payer: Self-pay

## 2020-03-10 ENCOUNTER — Emergency Department (HOSPITAL_COMMUNITY)
Admission: EM | Admit: 2020-03-10 | Discharge: 2020-03-10 | Disposition: A | Discharge status: Home / Self Care | Payer: No Typology Code available for payment source | Attending: Pediatric Emergency Medicine | Admitting: Pediatric Emergency Medicine

## 2020-03-10 DIAGNOSIS — Z91040 Latex allergy status: Secondary | ICD-10-CM | POA: Insufficient documentation

## 2020-03-10 DIAGNOSIS — Y92410 Unspecified street and highway as the place of occurrence of the external cause: Secondary | ICD-10-CM | POA: Insufficient documentation

## 2020-03-10 DIAGNOSIS — M549 Dorsalgia, unspecified: Secondary | ICD-10-CM | POA: Diagnosis not present

## 2020-03-10 DIAGNOSIS — R519 Headache, unspecified: Secondary | ICD-10-CM | POA: Diagnosis present

## 2020-03-10 MED ORDER — ACETAMINOPHEN 325 MG PO TABS
650.0000 mg | ORAL_TABLET | Freq: Once | ORAL | Status: AC
  Administered 2020-03-10: 650 mg via ORAL
  Filled 2020-03-10: qty 2
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
## 2020-03-10 NOTE — Discharge Instructions (Signed)

## 2020-03-10 NOTE — ED Notes (Signed)
Pt called for triage x1 
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
## 2020-03-13 ENCOUNTER — Other Ambulatory Visit: Payer: BC Managed Care – PPO

## 2021-07-19 ENCOUNTER — Ambulatory Visit (HOSPITAL_COMMUNITY)
Admission: EM | Admit: 2021-07-19 | Discharge: 2021-07-19 | Disposition: A | Discharge status: Home / Self Care | Payer: BC Managed Care – PPO | Attending: Emergency Medicine | Admitting: Emergency Medicine

## 2021-07-19 ENCOUNTER — Encounter (HOSPITAL_COMMUNITY): Payer: Self-pay

## 2021-07-19 DIAGNOSIS — M7989 Other specified soft tissue disorders: Secondary | ICD-10-CM | POA: Insufficient documentation

## 2021-07-19 DIAGNOSIS — R519 Headache, unspecified: Secondary | ICD-10-CM | POA: Diagnosis not present

## 2021-07-19 LAB — COMPREHENSIVE METABOLIC PANEL
ALT: 12 U/L (ref 0–44)
AST: 16 U/L (ref 15–41)
Albumin: 4 g/dL (ref 3.5–5.0)
Alkaline Phosphatase: 61 U/L (ref 38–126)
Anion gap: 8 (ref 5–15)
BUN: 11 mg/dL (ref 6–20)
CO2: 25 mmol/L (ref 22–32)
Calcium: 9.2 mg/dL (ref 8.9–10.3)
Chloride: 106 mmol/L (ref 98–111)
Creatinine, Ser: 0.86 mg/dL (ref 0.44–1.00)
GFR, Estimated: >60 mL/min (ref >60)
Glucose, Bld: 168 mg/dL — ABNORMAL HIGH (ref 70–99)
Potassium: 3.6 mmol/L (ref 3.5–5.1)
Sodium: 139 mmol/L (ref 135–145)
Total Bilirubin: 0.3 mg/dL (ref 0.3–1.2)
Total Protein: 7.2 g/dL (ref 6.5–8.1)

## 2021-07-19 MED ORDER — KETOROLAC TROMETHAMINE 30 MG/ML IJ SOLN
INTRAMUSCULAR | Status: AC
  Filled 2021-07-19: qty 1

## 2021-07-19 MED ORDER — KETOROLAC TROMETHAMINE 30 MG/ML IJ SOLN
30.0000 mg | Freq: Once | INTRAMUSCULAR | Status: AC
  Administered 2021-07-19: 30 mg via INTRAMUSCULAR

## 2021-07-19 NOTE — ED Provider Notes (Signed)
[facility]    CSN: [id number] Arrival date & time: 07/19/21  1828      History   Chief Complaint Chief Complaint  Patient presents with   Headache    HPI Colleen Luna is a 51 y.o. female.   Patient presents with a constant generalized headache for 2 weeks.  Associated phonophobia and photophobia.  Endorses that symptoms worsened this morning with accompanying dizziness.  Has attempted use of ibuprofen which was ineffective.  Denies nausea, vomiting, visual disturbance, lightheadedness, syncope, weakness, memory or speech changes.  Has primary doctor.  Patient presents with bilateral ankle swelling for approximately 6 months.  Swelling is present at all times.  Not worsened by any activity or diet changes.  Denies pain.  Range of motion of ankle is intact.  Denies numbness or tingling.  Denies cardiac, renal history.  History of sarcoidosis affecting the liver and spleen.    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 Authorizing Provider  clotrimazole-betamethasone (LOTRISONE) cream Apply topically 2 (two) times daily. x 7-14 days 11/26/11   Earnstine Regal, PA-C  Multiple Vitamins-Minerals (MULTIVITAMIN PO) Take by mouth.    [provider]  OVER THE COUNTER MEDICATION Take 1 tablet by mouth daily. "Hairfinity" supplement     [provider]    Family History Family History  Problem Relation Age of Onset   Multiple sclerosis Sister        2013    Social History Social History   Tobacco Use   Smoking status: Never   Smokeless tobacco: Never  Substance Use Topics   Alcohol use: No   Drug use: No     Allergies   Latex   Review of Systems Review of Systems  Constitutional: Negative.   Respiratory: Negative.    Cardiovascular: Negative.   Skin: Negative.   Neurological:  Positive for headaches. Negative for dizziness, tremors, seizures, syncope, facial asymmetry, speech difficulty, weakness, light-headedness and numbness.    Physical Exam Triage Vital Signs  ED Triage Vitals  Enc Vitals Group     BP 07/19/21 1943 (!) 159/91     Pulse Rate 07/19/21 1943 89     Resp 07/19/21 1943 18     Temp 07/19/21 1943 97.9 F (36.6 C)     Temp Source 07/19/21 1943 Oral     SpO2 07/19/21 1943 99 %     Weight --      Height --      Head Circumference --      Peak Flow --      Pain Score 07/19/21 1944 10     Pain Loc --      Pain Edu? --      Excl. in [place]? --    No data found.  Updated Vital Signs BP (!) 159/91 (BP Location: Left Arm)   Pulse 89   Temp 97.9 F (36.6 C) (Oral)   Resp 18   SpO2 99%   Visual Acuity Right Eye Distance:   Left Eye Distance:   Bilateral Distance:    Right Eye Near:   Left Eye Near:    Bilateral Near:     Physical Exam Constitutional:      Appearance: Normal appearance.  HENT:     Head: Normocephalic.  Eyes:      Extraocular Movements: Extraocular movements intact.     Conjunctiva/sclera: Conjunctivae normal.     Pupils: Pupils are equal, round, and reactive to light.  Pulmonary:     Effort: Pulmonary effort is normal.  Musculoskeletal:     Comments: Nonpitting edema bilaterally to the ankles, range of motion intact, 2+ dorsalis pedis pulse, able to bear weight  Neurological:     General: No focal deficit present.     Mental Status: She is alert and oriented to person, place, and time. Mental status is at baseline.     Cranial Nerves: No cranial nerve deficit.     Sensory: No sensory deficit.     Motor: No weakness.  Psychiatric:        Mood and Affect: Mood normal.        Behavior: Behavior normal.     UC Treatments / Results  Labs (all labs ordered are listed, but only abnormal results are displayed) Labs Reviewed - No data to display  EKG   Radiology No results found.  Procedures Procedures (including critical care time)  Medications Ordered in UC Medications - No data to display  Initial Impression / Assessment and Plan / UC Course  I have reviewed the triage vital signs and the nursing notes.  Pertinent labs & imaging results that were available during my care of the patient were reviewed by me and considered in my medical decision making (see chart for details).  Left leg swelling Right leg swelling Bad headache  Vital signs are stable, blood pressure elevated at 159/91, patient does not have history of hypertension, elevation possibly related to pain, discussed with patient, no abnormalities noted neurologically, will move forward with treatment of pain, Toradol injection given in office, declined Decadron and Imitrex, recommended continued use of Tylenol, ibuprofen and outpatient, advised patient to ensure that she is getting adequate rest and has an adequate fluid intake to prevent exacerbation of symptoms, may follow-up with PCP if symptoms continue to persist given  precautions that if she begins to have the worst headache she has ever experienced to go to the nearest emergency department  Nonpitting edema is noted to the bilateral ankles, CMP pending, will defer  imaging as no injury has occurred, recommended patient use compression stockings and elevation for management of symptoms with follow-up with PCP for further evaluation Final Clinical Impressions(s) / UC Diagnoses   Final diagnoses:  None   Discharge Instructions   None    ED Prescriptions   None    PDMP not reviewed this encounter.   Hans Eden, [place] 07/23/21 [phone]

## 2021-07-19 NOTE — Discharge Instructions (Signed)
For headache -No abnormalities was noted on your neurological exam -You have been given an injection today to reduce inflammation and ideally reduce your headache -You may take 800 mg of ibuprofen every 8 hours while at home as well as Tylenol 500 to 1000 mg every 6 hours as needed for comfort -Attempt to get adequate rest as fatigue will exacerbate your symptoms -Ensure that you are drinking lots of water as dehydration will exacerbate your symptoms, avoid salt and high levels of caffeine -If your headache continues to persist please follow-up with your primary doctor for further evaluation -At any point if you experience the worst headache of your life please go to the hospital   For your leg swelling -I have a low suspicion that this is more serious such as a blood clot as both legs are swollen -There is no indention in your legs when examined I have low suspicion that there is fluid present -You may use compression hose as needed to help with swelling -You may use elevation to help with swelling -Labs have been completed to assess your electrolytes, kidneys and liver, if anything is of concern you will be notified and told how to move forward -You are still having concerns about the swelling in your legs please follow-up with your primary doctor for further evaluation and management

## 2021-07-19 NOTE — ED Triage Notes (Signed)
Pt c/o slight headache x3 days and today pain was a 10/10, has had bilateral ankle swelling for months.

## 2022-08-15 ENCOUNTER — Other Ambulatory Visit: Payer: Self-pay | Admitting: Obstetrics and Gynecology

## 2022-08-23 ENCOUNTER — Other Ambulatory Visit: Payer: Self-pay | Admitting: Obstetrics and Gynecology
